# Patient Record
Sex: Male | Born: 1950 | Race: White | Hispanic: No | Smoking: Former smoker
Health system: Southern US, Community
[De-identification: ages and names within clinical notes are randomized; demographics above are authoritative.]

## PROBLEM LIST (undated history)

## (undated) DIAGNOSIS — I10 Essential (primary) hypertension: Secondary | ICD-10-CM

---

## 2011-06-24 ENCOUNTER — Emergency Department (HOSPITAL_COMMUNITY)
Admission: EM | Admit: 2011-06-24 | Discharge: 2011-06-24 | Disposition: A | Discharge status: Home / Self Care | Payer: 59 | Attending: Emergency Medicine | Admitting: Emergency Medicine

## 2011-06-24 ENCOUNTER — Emergency Department (HOSPITAL_COMMUNITY): Payer: 59

## 2011-06-24 DIAGNOSIS — R232 Flushing: Secondary | ICD-10-CM | POA: Insufficient documentation

## 2011-06-24 DIAGNOSIS — R61 Generalized hyperhidrosis: Secondary | ICD-10-CM | POA: Insufficient documentation

## 2011-06-24 DIAGNOSIS — R55 Syncope and collapse: Secondary | ICD-10-CM | POA: Insufficient documentation

## 2011-06-24 DIAGNOSIS — R42 Dizziness and giddiness: Secondary | ICD-10-CM | POA: Insufficient documentation

## 2011-06-24 LAB — URINALYSIS, ROUTINE W REFLEX MICROSCOPIC
Bilirubin Urine: NEGATIVE
Glucose, UA: 100 mg/dL — AB
Hgb urine dipstick: NEGATIVE
Ketones, ur: NEGATIVE mg/dL
pH: 6 (ref 5.0–8.0)

## 2011-06-24 LAB — COMPREHENSIVE METABOLIC PANEL
ALT: 13 U/L (ref 0–53)
AST: 17 U/L (ref 0–37)
Albumin: 3 g/dL — ABNORMAL LOW (ref 3.5–5.2)
Alkaline Phosphatase: 41 U/L (ref 39–117)
CO2: 23 mEq/L (ref 19–32)
Chloride: 108 mEq/L (ref 96–112)
GFR calc non Af Amer: >60 mL/min (ref >60)
Potassium: 3.8 mEq/L (ref 3.5–5.1)
Sodium: 141 mEq/L (ref 135–145)
Total Bilirubin: 0.2 mg/dL — ABNORMAL LOW (ref 0.3–1.2)

## 2011-06-24 LAB — CBC
HCT: 37.4 % — ABNORMAL LOW (ref 39.0–52.0)
MCH: 31.3 pg (ref 26.0–34.0)
MCV: 90.1 fL (ref 78.0–100.0)
RBC: 4.15 MIL/uL — ABNORMAL LOW (ref 4.22–5.81)
WBC: 4.6 10*3/uL (ref 4.0–10.5)

## 2011-06-24 LAB — DIFFERENTIAL
Eosinophils Absolute: 0.3 10*3/uL (ref 0.0–0.7)
Eosinophils Relative: 7 % — ABNORMAL HIGH (ref 0–5)
Lymphocytes Relative: 35 % (ref 12–46)
Lymphs Abs: 1.6 10*3/uL (ref 0.7–4.0)
Monocytes Relative: 7 % (ref 3–12)
Neutrophils Relative %: 51 % (ref 43–77)

## 2011-06-24 LAB — TROPONIN I: Troponin I: <0.3 ng/mL (ref <0.30)

## 2011-06-24 LAB — CK TOTAL AND CKMB (NOT AT ARMC): Relative Index: 0.9 (ref 0.0–2.5)

## 2011-06-24 LAB — URINE MICROSCOPIC-ADD ON

## 2011-06-25 LAB — URINE CULTURE: Culture  Setup Time: 201207222124

## 2014-04-29 ENCOUNTER — Encounter (HOSPITAL_COMMUNITY): Payer: Self-pay | Admitting: Emergency Medicine

## 2014-04-29 ENCOUNTER — Emergency Department (HOSPITAL_COMMUNITY)
Admission: EM | Admit: 2014-04-29 | Discharge: 2014-04-29 | Discharge status: Against Medical Advice | Payer: 59 | Attending: Emergency Medicine | Admitting: Emergency Medicine

## 2014-04-29 DIAGNOSIS — R42 Dizziness and giddiness: Secondary | ICD-10-CM | POA: Insufficient documentation

## 2014-04-29 HISTORY — DX: Essential (primary) hypertension: I10

## 2014-04-29 LAB — CBC
HCT: 41.5 % (ref 39.0–52.0)
Hemoglobin: 14.7 g/dL (ref 13.0–17.0)
MCH: 32.5 pg (ref 26.0–34.0)
MCHC: 35.4 g/dL (ref 30.0–36.0)
MCV: 91.8 fL (ref 78.0–100.0)
Platelets: 247 10*3/uL (ref 150–400)
RBC: 4.52 MIL/uL (ref 4.22–5.81)
RDW: 12.3 % (ref 11.5–15.5)
WBC: 8.4 10*3/uL (ref 4.0–10.5)

## 2014-04-29 LAB — COMPREHENSIVE METABOLIC PANEL
ALK PHOS: 48 U/L (ref 39–117)
ALT: 23 U/L (ref 0–53)
AST: 23 U/L (ref 0–37)
Albumin: 3.9 g/dL (ref 3.5–5.2)
BILIRUBIN TOTAL: 0.4 mg/dL (ref 0.3–1.2)
BUN: 18 mg/dL (ref 6–23)
CHLORIDE: 102 meq/L (ref 96–112)
CO2: 23 mEq/L (ref 19–32)
Calcium: 9.7 mg/dL (ref 8.4–10.5)
Creatinine, Ser: 1.03 mg/dL (ref 0.50–1.35)
GFR calc Af Amer: 87 mL/min — ABNORMAL LOW (ref >90)
GFR, EST NON AFRICAN AMERICAN: 75 mL/min — AB (ref >90)
GLUCOSE: 78 mg/dL (ref 70–99)
POTASSIUM: 4.4 meq/L (ref 3.7–5.3)
SODIUM: 138 meq/L (ref 137–147)
Total Protein: 7 g/dL (ref 6.0–8.3)

## 2014-04-29 LAB — DIFFERENTIAL
Basophils Absolute: 0 10*3/uL (ref 0.0–0.1)
Basophils Relative: 0 % (ref 0–1)
EOS PCT: 7 % — AB (ref 0–5)
Eosinophils Absolute: 0.6 10*3/uL (ref 0.0–0.7)
LYMPHS ABS: 2.1 10*3/uL (ref 0.7–4.0)
Lymphocytes Relative: 25 % (ref 12–46)
Monocytes Absolute: 0.8 10*3/uL (ref 0.1–1.0)
Monocytes Relative: 10 % (ref 3–12)
NEUTROS PCT: 58 % (ref 43–77)
Neutro Abs: 4.8 10*3/uL (ref 1.7–7.7)

## 2014-04-29 LAB — I-STAT TROPONIN, ED: Troponin i, poc: 0 ng/mL (ref 0.00–0.08)

## 2014-04-29 NOTE — ED Notes (Signed)
Pt stated that he was feeling better and was going to go home

## 2014-04-29 NOTE — ED Notes (Signed)
Pt reports waking up this am with numbness to left arm, dizziness and "a salty taste in his mouth." states that symptoms have resolved pta, grips are equal, no arm drift or facial droop noted at triage.

## 2014-04-30 ENCOUNTER — Encounter (HOSPITAL_COMMUNITY): Payer: Self-pay | Admitting: Emergency Medicine

## 2014-04-30 ENCOUNTER — Emergency Department (HOSPITAL_COMMUNITY)
Admission: EM | Admit: 2014-04-30 | Discharge: 2014-04-30 | Disposition: A | Discharge status: Home / Self Care | Payer: 59 | Attending: Emergency Medicine | Admitting: Emergency Medicine

## 2014-04-30 ENCOUNTER — Emergency Department (HOSPITAL_COMMUNITY): Payer: 59

## 2014-04-30 DIAGNOSIS — Z7982 Long term (current) use of aspirin: Secondary | ICD-10-CM | POA: Insufficient documentation

## 2014-04-30 DIAGNOSIS — I10 Essential (primary) hypertension: Secondary | ICD-10-CM | POA: Insufficient documentation

## 2014-04-30 DIAGNOSIS — G459 Transient cerebral ischemic attack, unspecified: Secondary | ICD-10-CM | POA: Insufficient documentation

## 2014-04-30 DIAGNOSIS — Z79899 Other long term (current) drug therapy: Secondary | ICD-10-CM | POA: Insufficient documentation

## 2014-04-30 MED ORDER — ASPIRIN EC 325 MG PO TBEC: 325.0000 mg | DELAYED_RELEASE_TABLET | Freq: Every day | ORAL | Status: AC

## 2014-04-30 NOTE — ED Provider Notes (Signed)
CSN: [id number]     Arrival date & time 04/30/14  1497 History   First MD Initiated Contact with Patient 04/30/14 [phone]     Chief Complaint  Patient presents with  . Dizziness     (Consider location/radiation/quality/duration/timing/severity/associated sxs/prior Treatment) HPI 63 year old male with history of hypertension no history of stroke no history of coronary artery disease nonsmoker last known well Wednesday evening at about 10:30 PM yesterday morning Thursday woke up and had several hours between 4-6 hours of left arm slight numbness and tingling with abnormal sensation to his tongue as well as slight difficulty walking with a slightly abnormal possibly ataxic gait without lightheadedness or sensation of vertigo, he had no headache, he had no change in speech vision swallowing or understanding and did not feel any weakness; his symptoms completely resolved at about noon yesterday and has been asymptomatic since that time, he came to the emergency department yesterday evening and had blood drawn but the ED was busy and he left without being seen by provider, he woke up this morning feeling normal but decided he should come back to the ED to complete his evaluation. Past Medical History  Diagnosis Date  . Hypertension    History reviewed. No pertinent past surgical history. No family history on file. History  Substance Use Topics  . Smoking status: Never Smoker   . Smokeless tobacco: Not on file  . Alcohol Use: Yes     Comment: occ    Review of Systems 10 Systems reviewed and are negative for acute change except as noted in the HPI.   Allergies  Review of patient's allergies indicates no known allergies.  Home Medications   Prior to Admission medications   Medication Sig Start Date End Date Taking? Authorizing Provider  losartan (COZAAR) 50 MG tablet Take 50 mg by mouth daily.   Yes Historical Provider, MD  aspirin EC 325 MG tablet Take 1 tablet (325 mg total) by mouth  daily. 04/30/14   Babette Relic, MD   BP 128/80  Pulse 61  Temp(Src) 98.2 F (36.8 C)  Resp 19  SpO2 99% Physical Exam  Nursing note and vitals reviewed. Constitutional:  Awake, alert, nontoxic appearance with baseline speech for patient.  HENT:  Head: Atraumatic.  Mouth/Throat: No oropharyngeal exudate.  Eyes: EOM are normal. Pupils are equal, round, and reactive to light. Right eye exhibits no discharge. Left eye exhibits no discharge.  No nystagmus; negative test of skew  Neck: Neck supple.  Cardiovascular: Normal rate and regular rhythm.   No murmur heard. Pulmonary/Chest: Effort normal and breath sounds normal. No stridor. No respiratory distress. He has no wheezes. He has no rales. He exhibits no tenderness.  Abdominal: Soft. Bowel sounds are normal. He exhibits no mass. There is no tenderness. There is no rebound.  Musculoskeletal: He exhibits no tenderness.  Baseline ROM, moves extremities with no obvious new focal weakness.  Lymphadenopathy:    He has no cervical adenopathy.  Neurological: He is alert.  Awake, alert, cooperative and aware of situation; motor strength bilaterally; sensation normal to light touch bilaterally; peripheral visual fields full to confrontation; no facial asymmetry; tongue midline; major cranial nerves appear intact; no pronator drift, normal finger to nose bilaterally, baseline gait without new ataxia.  Skin: No rash noted.  Psychiatric: He has a normal mood and affect.    ED Course  Procedures (including critical care time) D/w Neuro recs discharge OutPt f/u.Patient informed of clinical course, understand medical decision-making process, and agree  with plan.  Labs Review Labs Reviewed - No data to display  Imaging Review Mr Brain Wo Contrast  04/30/2014   CLINICAL DATA:  Left arm numbness and gait ataxia yesterday. Dizziness.  EXAM: MRI HEAD WITHOUT CONTRAST  TECHNIQUE: Multiplanar, multiecho pulse sequences of the brain and surrounding  structures were obtained without intravenous contrast.  COMPARISON:  None.  FINDINGS: There is no evidence of acute infarct or intracranial hemorrhage. No significant white matter disease is seen. There is no evidence of mass, midline shift, or extra-axial fluid collection. There is mild cerebral atrophy.  Orbits are unremarkable. Minimal maxillary sinus mucosal thickening is noted bilaterally. There is mild bilateral ethmoid air cell mucosal thickening. Mastoid air cells are clear. Major intracranial vascular flow voids are preserved.  IMPRESSION: Mild cerebral atrophy, otherwise unremarkable appearance of the brain.   Electronically Signed   By: Logan Bores   On: 04/30/2014 12:08     EKG Interpretation   Date/Time:  Friday Apr 30 2014 08:28:35 EDT Ventricular Rate:  64 PR Interval:  144 QRS Duration: 90 QT Interval:  400 QTC Calculation: 412 R Axis:   84 Text Interpretation:  Normal sinus rhythm Normal ECG No significant change  since last tracing Confirmed by [facility] [facility]  MD, Jenny Reichmann ([id number]) on 04/30/2014  10:15:04 AM      MDM   Final diagnoses:  TIA (transient ischemic attack)    I doubt any other EMC precluding discharge at this time including, but not necessarily limited to the following:CVA.    Babette Relic, MD 05/02/14 [phone]

## 2014-04-30 NOTE — ED Notes (Signed)
Returned from MRI 

## 2014-04-30 NOTE — ED Notes (Signed)
Patient transported to MRI 

## 2014-04-30 NOTE — ED Notes (Addendum)
Pt reports onset of dizziness since yesterday. Numbness in left arm. No arm drift, grips equal, speech clear, no facial droop. Came here yesterday, waited until 2am, was not seen and decided to go home. Blood work and EKG done yesterday. Does not want to have blood work repeated at this time. Repeated EKG now. Denies pain.

## 2014-04-30 NOTE — Discharge Instructions (Signed)
You may have had a transient ischemic attack (TIA). This means that the nervous system did not work properly for a short time. It is caused by a low oxygen supply to an area of your brain. It may be caused by a small blood clot or hardening of the arteries. This is a temporary neurologic condition. It usually gets better within thirty minutes, but always within twenty-four hours. If this does not resolve within that time period, it is defined as a stroke. TIA's are warning signs that you are at risk for having a stroke. A small percentage of patients who have had a TIA will have a stroke within a couple days, and up to 20% will have a stroke within 3 months. °SEEK IMMEDIATE MEDICAL ATTENTION (Call 911) IF: °The original symptoms that brought you in are getting worse, or if you develop any new change in speech, vision, swallowing, or understanding, incoordination, weakness, numbness, tingling, dizziness, fainting, severe headache, chest pain, or other concerns. Some patients who are having a stroke are eligible to receive a medication which may improve their outcome, but the drug usually must be given within three hours from when symptoms first occur. So if you think you might be having stroke symptoms, don't wait, call 911. °

## 2014-04-30 NOTE — ED Notes (Addendum)
Patient returned from MRI, monitor reapplied

## 2014-05-28 ENCOUNTER — Telehealth: Payer: Self-pay | Admitting: Neurology

## 2014-05-28 ENCOUNTER — Ambulatory Visit (INDEPENDENT_AMBULATORY_CARE_PROVIDER_SITE_OTHER): Payer: 59 | Admitting: Neurology

## 2014-05-28 ENCOUNTER — Encounter: Payer: Self-pay | Admitting: Neurology

## 2014-05-28 VITALS — BP 122/78 | HR 73 | Temp 98.1°F | Ht 70.5 in | Wt 177.7 lb

## 2014-05-28 DIAGNOSIS — G459 Transient cerebral ischemic attack, unspecified: Secondary | ICD-10-CM

## 2014-05-28 NOTE — Patient Instructions (Addendum)
You probably had a transient ischemic attack (TIA), which is a "mini-stroke" 1.  Start taking aspirin 81mg  daily 2.  We will check cholesterol (fasting lipid panel) and check for diabetes (hemoglobin A1c) 3.  We will check CTA of the head and neck [place] 05/31/14 8:15 am  Nothing to eat or drink after Midnight  4.  We will check 2d echocardiogram July 3 at 9:45am  [place]  5.  Blood pressure control is important 6.  Mediterranean diet is recommended 7.  Follow up in 3 months.

## 2014-05-28 NOTE — Progress Notes (Signed)
NEUROLOGY CONSULTATION NOTE  Joseph Curry MRN: [id number] DOB: 02-17-1951  Referring provider: Riki Altes, MD  Primary care provider: Matthias Hughs, MD  Reason for consult:  TIA  HISTORY OF PRESENT ILLNESS: Joseph Curry is a 63 year old right-handed man with history of hypertension who presents for evaluation of TIA.  Records and images personally reviewed.  On the morning of 04/29/14, he woke up, feeling fine.  While in the shower, he noted left arm numbness and tingling, lightheadedness, and salty taste in his mouth.  He also felt a little unbalanced but no spinning sensation.  Symptoms lasted about 4-6 hours.  There was no associated headache, vision disturbance or focal weakness.  He did go to the ED, as symptoms resolved, he left the ED but returned the next day to be checked out.  MRI BRAIN WO CONTRAST was performed, which showed mild atrophy.  No acute abnormalities.  EKG showed normal sinus rhythm of 64 bpm.  He felt a little unsteady for a few more days but then this resolved.  The only medication he takes is losartan.  PAST MEDICAL HISTORY: Past Medical History  Diagnosis Date  . Hypertension     PAST SURGICAL HISTORY: No past surgical history on file.  MEDICATIONS: Current Outpatient Prescriptions on File Prior to Visit  Medication Sig Dispense Refill  . losartan (COZAAR) 50 MG tablet Take 50 mg by mouth daily.      Marland Kitchen aspirin EC 325 MG tablet Take 1 tablet (325 mg total) by mouth daily.  30 tablet  0   No current facility-administered medications on file prior to visit.    ALLERGIES: No Known Allergies  FAMILY HISTORY: No family history on file.  SOCIAL HISTORY: History   Social History  . Marital Status: Divorced    Spouse Name: N/A    Number of Children: N/A  . Years of Education: N/A   Occupational History  . Not on file.   Social History Main Topics  . Smoking status: Former Research scientist (life sciences)  . Smokeless tobacco: Not on file  . Alcohol Use: Yes   Comment: occ  . Drug Use: No  . Sexual Activity: Not on file   Other Topics Concern  . Not on file   Social History Narrative  . No narrative on file    REVIEW OF SYSTEMS: Constitutional: No fevers, chills, or sweats, no generalized fatigue, change in appetite Eyes: No visual changes, double vision, eye pain Ear, nose and throat: No hearing loss, ear pain, nasal congestion, sore throat Cardiovascular: No chest pain, palpitations Respiratory:  No shortness of breath at rest or with exertion, wheezes GastrointestinaI: No nausea, vomiting, diarrhea, abdominal pain, fecal incontinence Genitourinary:  No dysuria, urinary retention or frequency Musculoskeletal:  No neck pain, back pain Integumentary: No rash, pruritus, skin lesions Neurological: as above Psychiatric: No depression, insomnia, anxiety Endocrine: No palpitations, fatigue, diaphoresis, mood swings, change in appetite, change in weight, increased thirst Hematologic/Lymphatic:  No anemia, purpura, petechiae. Allergic/Immunologic: no itchy/runny eyes, nasal congestion, recent allergic reactions, rashes  PHYSICAL EXAM: Filed Vitals:   05/28/14 0836  BP: 122/78  Pulse: 73  Temp: 98.1 F (36.7 C)   General: No acute distress Head:  Normocephalic/atraumatic Neck: supple, no paraspinal tenderness, full range of motion Back: No paraspinal tenderness Heart: regular rate and rhythm Lungs: Clear to auscultation bilaterally. Vascular: No carotid bruits. Neurological Exam: Mental status: alert and oriented to person, place, and time, recent and remote memory intact, fund of knowledge intact, attention and  concentration intact, speech fluent and not dysarthric, language intact. Cranial nerves: CN I: not tested CN II: pupils equal, round and reactive to light, visual fields intact, fundi unremarkable, without vessel changes, exudates, hemorrhages or papilledema. CN III, IV, VI:  full range of motion, no nystagmus, no ptosis CN  V: facial sensation intact CN VII: upper and lower face symmetric CN VIII: hearing intact CN IX, X: gag intact, uvula midline CN XI: sternocleidomastoid and trapezius muscles intact CN XII: tongue midline Bulk & Tone: normal, no fasciculations. Motor: 5 out of 5 throughout Sensation: Temperature and vibration intact Deep Tendon Reflexes: 2+ throughout, toes downgoing Finger to nose testing: No dysmetria Heel to shin: No dysmetria Gait: Normal station and stride. Able to turn and walk in tandem. Romberg negative.  IMPRESSION: Transient ischemic attack. Currently symptom free  PLAN: 1. Start aspirin 81 mg daily for secondary stroke prevention. 2. Check fasting lipid panel (LDL goal should be less than 100) and hemoglobin A1c 3. we will check a CTA of the head and neck. 4. We will check a 2-D echo. 5. Recommended Mediterranean diet. 6. Followup in 3 months.  Thank you for allowing me to take part in the care of this patient.  Metta Clines, DO  CC:  Matthias Hughs, MD

## 2014-05-28 NOTE — Telephone Encounter (Signed)
Pt is returning your call please call °

## 2014-05-31 ENCOUNTER — Ambulatory Visit (HOSPITAL_COMMUNITY): Payer: 59

## 2014-06-04 ENCOUNTER — Other Ambulatory Visit (HOSPITAL_COMMUNITY): Payer: 59

## 2014-06-09 ENCOUNTER — Ambulatory Visit (HOSPITAL_COMMUNITY): Payer: 59

## 2014-06-15 LAB — HEMOGLOBIN A1C
Hgb A1c MFr Bld: 5.9 % — ABNORMAL HIGH (ref <5.7)
MEAN PLASMA GLUCOSE: 123 mg/dL — AB (ref <117)

## 2014-06-15 LAB — LIPID PANEL
CHOL/HDL RATIO: 3 ratio
Cholesterol: 217 mg/dL — ABNORMAL HIGH (ref 0–200)
HDL: 73 mg/dL (ref >39)
LDL CALC: 118 mg/dL — AB (ref 0–99)
Triglycerides: 132 mg/dL (ref <150)
VLDL: 26 mg/dL (ref 0–40)

## 2014-06-16 ENCOUNTER — Ambulatory Visit (HOSPITAL_COMMUNITY)
Admission: RE | Admit: 2014-06-16 | Discharge: 2014-06-16 | Disposition: A | Discharge status: Home / Self Care | Payer: 59 | Source: Ambulatory Visit | Attending: Neurology | Admitting: Neurology

## 2014-06-16 ENCOUNTER — Telehealth: Payer: Self-pay | Admitting: Neurology

## 2014-06-16 DIAGNOSIS — I7090 Unspecified atherosclerosis: Secondary | ICD-10-CM | POA: Insufficient documentation

## 2014-06-16 DIAGNOSIS — G459 Transient cerebral ischemic attack, unspecified: Secondary | ICD-10-CM

## 2014-06-16 MED ORDER — IOHEXOL 350 MG/ML SOLN
80.0000 mL | Freq: Once | INTRAVENOUS | Status: AC | PRN
  Administered 2014-06-16: 80 mL via INTRAVENOUS

## 2014-06-16 NOTE — Telephone Encounter (Signed)
Discussed results of CTA with Joseph Curry.  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  Based on the symptoms of his TIA, I can't say if this focal stenosis is the main cause.  However, he is at greater risk for a posterior circulation stroke.  Typically, there is no surgical intervention for the vertebral arteries and would just continue antiplatelet therapy.  I offered to find out if there are any local vascular interventional radiologists that may want to take a look at it, but he would just like to continue the antiplatelet therapy for now.  We will discuss further at next visit.

## 2014-08-02 ENCOUNTER — Telehealth: Payer: Self-pay | Admitting: Neurology

## 2014-08-02 NOTE — Telephone Encounter (Signed)
Pt cancelled his appt on 08-31-14 to see you he did not resch

## 2014-08-11 ENCOUNTER — Ambulatory Visit: Payer: 59 | Admitting: Neurology

## 2014-08-31 ENCOUNTER — Ambulatory Visit: Payer: 59 | Admitting: Neurology

## 2016-03-28 DIAGNOSIS — I10 Essential (primary) hypertension: Secondary | ICD-10-CM | POA: Diagnosis not present

## 2016-08-29 DIAGNOSIS — Z85828 Personal history of other malignant neoplasm of skin: Secondary | ICD-10-CM | POA: Diagnosis not present

## 2016-08-29 DIAGNOSIS — L570 Actinic keratosis: Secondary | ICD-10-CM | POA: Diagnosis not present

## 2016-10-01 DIAGNOSIS — E7800 Pure hypercholesterolemia, unspecified: Secondary | ICD-10-CM | POA: Diagnosis not present

## 2016-10-01 DIAGNOSIS — Z0000 Encounter for general adult medical examination without abnormal findings: Secondary | ICD-10-CM | POA: Diagnosis not present

## 2016-10-01 DIAGNOSIS — R7301 Impaired fasting glucose: Secondary | ICD-10-CM | POA: Diagnosis not present

## 2016-10-01 DIAGNOSIS — G459 Transient cerebral ischemic attack, unspecified: Secondary | ICD-10-CM | POA: Diagnosis not present

## 2016-10-01 DIAGNOSIS — I10 Essential (primary) hypertension: Secondary | ICD-10-CM | POA: Diagnosis not present

## 2016-10-01 DIAGNOSIS — Z6826 Body mass index (BMI) 26.0-26.9, adult: Secondary | ICD-10-CM | POA: Diagnosis not present

## 2016-11-28 DIAGNOSIS — E7800 Pure hypercholesterolemia, unspecified: Secondary | ICD-10-CM | POA: Diagnosis not present

## 2016-11-28 DIAGNOSIS — I10 Essential (primary) hypertension: Secondary | ICD-10-CM | POA: Diagnosis not present

## 2016-12-26 DIAGNOSIS — I10 Essential (primary) hypertension: Secondary | ICD-10-CM | POA: Diagnosis not present

## 2016-12-26 DIAGNOSIS — E7800 Pure hypercholesterolemia, unspecified: Secondary | ICD-10-CM | POA: Diagnosis not present

## 2017-01-10 DIAGNOSIS — H2513 Age-related nuclear cataract, bilateral: Secondary | ICD-10-CM | POA: Diagnosis not present

## 2017-06-19 ENCOUNTER — Encounter (HOSPITAL_COMMUNITY): Payer: Self-pay | Admitting: Emergency Medicine

## 2017-06-19 ENCOUNTER — Emergency Department (HOSPITAL_COMMUNITY)
Admission: EM | Admit: 2017-06-19 | Discharge: 2017-06-19 | Disposition: A | Discharge status: Home / Self Care | Payer: PPO | Attending: Physician Assistant | Admitting: Physician Assistant

## 2017-06-19 ENCOUNTER — Emergency Department (HOSPITAL_COMMUNITY): Payer: PPO

## 2017-06-19 DIAGNOSIS — Z79899 Other long term (current) drug therapy: Secondary | ICD-10-CM | POA: Insufficient documentation

## 2017-06-19 DIAGNOSIS — R112 Nausea with vomiting, unspecified: Secondary | ICD-10-CM | POA: Diagnosis not present

## 2017-06-19 DIAGNOSIS — Z7982 Long term (current) use of aspirin: Secondary | ICD-10-CM | POA: Diagnosis not present

## 2017-06-19 DIAGNOSIS — R55 Syncope and collapse: Secondary | ICD-10-CM | POA: Diagnosis not present

## 2017-06-19 DIAGNOSIS — R404 Transient alteration of awareness: Secondary | ICD-10-CM | POA: Diagnosis not present

## 2017-06-19 DIAGNOSIS — Z87891 Personal history of nicotine dependence: Secondary | ICD-10-CM | POA: Insufficient documentation

## 2017-06-19 DIAGNOSIS — R001 Bradycardia, unspecified: Secondary | ICD-10-CM | POA: Diagnosis not present

## 2017-06-19 DIAGNOSIS — I10 Essential (primary) hypertension: Secondary | ICD-10-CM | POA: Diagnosis not present

## 2017-06-19 LAB — CBC
HCT: 39.8 % (ref 39.0–52.0)
Hemoglobin: 13.8 g/dL (ref 13.0–17.0)
MCH: 30.9 pg (ref 26.0–34.0)
MCHC: 34.7 g/dL (ref 30.0–36.0)
MCV: 89.2 fL (ref 78.0–100.0)
PLATELETS: 261 10*3/uL (ref 150–400)
RBC: 4.46 MIL/uL (ref 4.22–5.81)
RDW: 12.3 % (ref 11.5–15.5)
WBC: 9.6 10*3/uL (ref 4.0–10.5)

## 2017-06-19 LAB — COMPREHENSIVE METABOLIC PANEL
ALBUMIN: 3.8 g/dL (ref 3.5–5.0)
ALT: 27 U/L (ref 17–63)
AST: 23 U/L (ref 15–41)
Alkaline Phosphatase: 49 U/L (ref 38–126)
Anion gap: 9 (ref 5–15)
BILIRUBIN TOTAL: 0.8 mg/dL (ref 0.3–1.2)
BUN: 15 mg/dL (ref 6–20)
CHLORIDE: 102 mmol/L (ref 101–111)
CO2: 25 mmol/L (ref 22–32)
Calcium: 9.6 mg/dL (ref 8.9–10.3)
Creatinine, Ser: 1.04 mg/dL (ref 0.61–1.24)
GFR calc Af Amer: >60 mL/min (ref >60)
GFR calc non Af Amer: >60 mL/min (ref >60)
GLUCOSE: 149 mg/dL — AB (ref 65–99)
POTASSIUM: 3.5 mmol/L (ref 3.5–5.1)
SODIUM: 136 mmol/L (ref 135–145)
Total Protein: 6.3 g/dL — ABNORMAL LOW (ref 6.5–8.1)

## 2017-06-19 LAB — I-STAT TROPONIN, ED: Troponin i, poc: 0 ng/mL (ref 0.00–0.08)

## 2017-06-19 LAB — CBG MONITORING, ED: GLUCOSE-CAPILLARY: 131 mg/dL — AB (ref 65–99)

## 2017-06-19 MED ORDER — ONDANSETRON HCL 4 MG PO TABS: 4.0000 mg | ORAL_TABLET | Freq: Every day | ORAL | 0 refills | Status: AC | PRN

## 2017-06-19 MED ORDER — ONDANSETRON HCL 4 MG/2ML IJ SOLN
4.0000 mg | Freq: Once | INTRAMUSCULAR | Status: AC
  Administered 2017-06-19: 4 mg via INTRAVENOUS
  Filled 2017-06-19: qty 2

## 2017-06-19 MED ORDER — SODIUM CHLORIDE 0.9 % IV BOLUS (SEPSIS)
1000.0000 mL | Freq: Once | INTRAVENOUS | Status: AC
  Administered 2017-06-19: 1000 mL via INTRAVENOUS

## 2017-06-19 NOTE — ED Triage Notes (Signed)
Pt to ED via [facility] medic [facility] from barber shop with witnessed syncopal episode as complaint. On arrival to ED- pt is alert/oriented x 4, extremely diaphoretic, speech clear. Denies any chest pain, denies headache, denies dizziness-- c/o nausea. Vomited prior to arrival.

## 2017-06-19 NOTE — ED Notes (Signed)
Pt returned from xray-- vomited while in xray-- ate frozen fruit mixed with yogurt this am, vomitus had strawberries present

## 2017-06-19 NOTE — ED Notes (Signed)
Pt given cup of wate per MD.

## 2017-06-19 NOTE — ED Provider Notes (Signed)
[facility] DEPT Provider Note   CSN: [id number] Arrival date & time: 06/19/17  5176     History   Chief Complaint Chief Complaint  Patient presents with  . Loss of Consciousness    HPI Joseph Curry is a 66 y.o. male.  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  Pt began to feel dizzy, turned pale, and began to vomit as he was passing out.  He was unconscious for less than 30 seconds according to bystanders.  Pt reports feeling nauseous also during the event but was nauseous earlier after eating some frozen fruit he heated up in the microwave and yogurt.  Pt did not lose bowel or bladder continence.  He denies CP, SOB, fatigue, fever, or recent illness or travel.  Pt denies a family history of heart disease or syncopal events.  Pt drinks alcohol most nights and said he had 2-3 vodka drinks last night.  He is a never smoker, and has no history of cardiac disease.  He does mention he had one prior syncopal episode 6 years ago while seated at church that was very similar and was attributed to dehydration due to having too much wine the night prior and not drinking enough water.        Past Medical History:  Diagnosis Date  . Hypertension     There are no active problems to display for this patient.   History reviewed. No pertinent surgical history.     Home Medications    Prior to Admission medications   Medication Sig Start Date End Date Taking? Authorizing Provider  aspirin EC 325 MG tablet Take 1 tablet (325 mg total) by mouth daily. 04/30/14   Riki Altes, MD  losartan (COZAAR) 50 MG tablet Take 50 mg by mouth daily.    [provider]    Family History No family history on file.  Social History Social History  Substance Use Topics  . Smoking status: Former Research scientist (life sciences)  . Smokeless tobacco: Never Used  . Alcohol use Yes     Comment: occ     Allergies   Patient has no known  allergies.   Review of Systems Review of Systems  Constitutional: Negative for chills and fever.  HENT: Negative for ear pain and sore throat.   Eyes: Negative for pain and visual disturbance.  Respiratory: Negative for cough and shortness of breath.   Cardiovascular: Negative for chest pain and palpitations.  Gastrointestinal: Positive for nausea and vomiting. Negative for abdominal pain.  Genitourinary: Negative for dysuria and hematuria.  Musculoskeletal: Negative for arthralgias and back pain.  Skin: Negative for color change and rash.  Neurological: Positive for dizziness and syncope. Negative for seizures, weakness and headaches.  Psychiatric/Behavioral: Negative for agitation and behavioral problems.  All other systems reviewed and are negative.    Physical Exam Updated Vital Signs BP 112/77 (BP Location: Right Arm)   Pulse 62   Temp (!) 97.5 F (36.4 C) (Oral)   Resp 13   Ht 5\' 10"  (1.778 m)   Wt 79.4 kg (175 lb)   SpO2 97%   BMI 25.11 kg/m   Physical Exam  Constitutional: He is oriented to person, place, and time. He appears well-developed and well-nourished.  HENT:  Head: Normocephalic and atraumatic.  Eyes: Conjunctivae are normal.  Neck: Neck supple.  Cardiovascular: Regular rhythm and intact distal pulses.  Bradycardia present.  Exam reveals no gallop and no friction rub.  No murmur heard. Pulmonary/Chest: Effort normal and breath sounds normal. No respiratory distress.  Abdominal: Soft. There is no tenderness.  Musculoskeletal: He exhibits no edema or deformity.  Neurological: He is alert and oriented to person, place, and time. No cranial nerve deficit.  Skin: Skin is warm and dry.  Psychiatric: He has a normal mood and affect.  Nursing note and vitals reviewed.    ED Treatments / Results  Labs (all labs ordered are listed, but only abnormal results are displayed) Labs Reviewed  COMPREHENSIVE METABOLIC PANEL - Abnormal; Notable for the following:        Result Value   Glucose, Bld 149 (*)    Total Protein 6.3 (*)    All other components within normal limits  CBC  URINALYSIS, ROUTINE W REFLEX MICROSCOPIC  I-STAT TROPOININ, ED  CBG MONITORING, ED    EKG  EKG Interpretation  Date/Time:  Wednesday June 19 2017 09:29:23 EDT Ventricular Rate:  65 PR Interval:    QRS Duration: 102 QT Interval:  426 QTC Calculation: 443 R Axis:   100 Text Interpretation:  Sinus rhythm Right axis deviation Low voltage, precordial leads Normal sinus rhythm Confirmed by Zenovia Jarred [phone]) on 06/19/2017 10:00:27 AM       Radiology Dg Chest 2 View  Result Date: 06/19/2017 CLINICAL DATA:  66 year old male with syncope and nausea at 0830 hours today. EXAM: CHEST  2 VIEW COMPARISON:  CTA neck 06/16/2014 FINDINGS: Semi upright AP and lateral views of the chest. Mild cardiomegaly with left atrial enlargement suspected. Other mediastinal contours are within normal limits. The lungs are clear. No pneumothorax, pulmonary edema, or pleural effusion. Visualized tracheal air column is within normal limits. No acute osseous abnormality identified. Negative visible bowel gas pattern. IMPRESSION: 1.  No acute cardiopulmonary abnormality. 2. Probable left atrial enlargement. Electronically Signed   By: Genevie Ann M.D.   On: 06/19/2017 10:43    Procedures Procedures (including critical care time)  Medications Ordered in ED Medications  ondansetron (ZOFRAN) injection 4 mg (4 mg Intravenous Given 06/19/17 1046)  sodium chloride 0.9 % bolus 1,000 mL (1,000 mLs Intravenous New Bag/Given 06/19/17 1046)     Initial Impression / Assessment and Plan / ED Course  I have reviewed the triage vital signs and the nursing notes.  Pertinent labs & imaging results that were available during my care of the patient were reviewed by me and considered in my medical decision making (see chart for details).     Syncope: DDX includes ACS, arrythmia, vasovagal syncope, less  likely PE wells score 0 -ACS workup negative -Head CT negative -Zofran and NS improved pts symptoms dramatically -After discussing with the patient the risks of not staying to be monitored for possible arrhthymias for 24 hours the patient feels well enough to go home and understands the risks.   -Pt tolerating PO well and a nurse monitoring pt walking around hospital with no issues as documented in chart.       Final Clinical Impressions(s) / ED Diagnoses   Final diagnoses:  None    New Prescriptions New Prescriptions   No medications on file     Katherine Roan, MD 06/19/17 1642    Macarthur Critchley, MD 06/20/17 [phone]

## 2017-06-25 DIAGNOSIS — R55 Syncope and collapse: Secondary | ICD-10-CM | POA: Diagnosis not present

## 2017-09-23 DIAGNOSIS — L570 Actinic keratosis: Secondary | ICD-10-CM | POA: Diagnosis not present

## 2017-09-23 DIAGNOSIS — Z85828 Personal history of other malignant neoplasm of skin: Secondary | ICD-10-CM | POA: Diagnosis not present

## 2017-09-23 DIAGNOSIS — L821 Other seborrheic keratosis: Secondary | ICD-10-CM | POA: Diagnosis not present

## 2017-10-04 DIAGNOSIS — I10 Essential (primary) hypertension: Secondary | ICD-10-CM | POA: Diagnosis not present

## 2017-10-04 DIAGNOSIS — E7800 Pure hypercholesterolemia, unspecified: Secondary | ICD-10-CM | POA: Diagnosis not present

## 2017-10-04 DIAGNOSIS — Z1211 Encounter for screening for malignant neoplasm of colon: Secondary | ICD-10-CM | POA: Diagnosis not present

## 2017-10-04 DIAGNOSIS — R7301 Impaired fasting glucose: Secondary | ICD-10-CM | POA: Diagnosis not present

## 2017-12-12 DIAGNOSIS — Z1211 Encounter for screening for malignant neoplasm of colon: Secondary | ICD-10-CM | POA: Diagnosis not present

## 2017-12-12 DIAGNOSIS — Z8673 Personal history of transient ischemic attack (TIA), and cerebral infarction without residual deficits: Secondary | ICD-10-CM | POA: Diagnosis not present

## 2017-12-12 DIAGNOSIS — E119 Type 2 diabetes mellitus without complications: Secondary | ICD-10-CM | POA: Diagnosis not present

## 2017-12-12 DIAGNOSIS — I10 Essential (primary) hypertension: Secondary | ICD-10-CM | POA: Diagnosis not present

## 2018-07-07 DIAGNOSIS — I10 Essential (primary) hypertension: Secondary | ICD-10-CM | POA: Diagnosis not present

## 2018-07-07 DIAGNOSIS — Z23 Encounter for immunization: Secondary | ICD-10-CM | POA: Diagnosis not present

## 2018-07-07 DIAGNOSIS — E7800 Pure hypercholesterolemia, unspecified: Secondary | ICD-10-CM | POA: Diagnosis not present

## 2018-09-23 DIAGNOSIS — Z85828 Personal history of other malignant neoplasm of skin: Secondary | ICD-10-CM | POA: Diagnosis not present

## 2018-09-23 DIAGNOSIS — L821 Other seborrheic keratosis: Secondary | ICD-10-CM | POA: Diagnosis not present

## 2018-09-23 DIAGNOSIS — L570 Actinic keratosis: Secondary | ICD-10-CM | POA: Diagnosis not present

## 2018-10-07 DIAGNOSIS — Z0000 Encounter for general adult medical examination without abnormal findings: Secondary | ICD-10-CM | POA: Diagnosis not present

## 2018-10-07 DIAGNOSIS — I10 Essential (primary) hypertension: Secondary | ICD-10-CM | POA: Diagnosis not present

## 2018-10-07 DIAGNOSIS — R7301 Impaired fasting glucose: Secondary | ICD-10-CM | POA: Diagnosis not present

## 2018-10-07 DIAGNOSIS — E7800 Pure hypercholesterolemia, unspecified: Secondary | ICD-10-CM | POA: Diagnosis not present

## 2018-10-07 DIAGNOSIS — Z130 Encounter for screening for diseases of the blood and blood-forming organs and certain disorders involving the immune mechanism: Secondary | ICD-10-CM | POA: Diagnosis not present

## 2018-10-07 DIAGNOSIS — Z1159 Encounter for screening for other viral diseases: Secondary | ICD-10-CM | POA: Diagnosis not present

## 2018-12-08 DIAGNOSIS — H2513 Age-related nuclear cataract, bilateral: Secondary | ICD-10-CM | POA: Diagnosis not present

## 2019-03-13 IMAGING — CT CT HEAD W/O CM
3 series · 15 of 47 positions shown, 18 images · non-contrast
Comparison: 06/16/2014

CLINICAL DATA: Syncopal episode

EXAM:
CT HEAD WITHOUT CONTRAST
TECHNIQUE: Contiguous axial images were obtained from the base of the skull
through the vertex without intravenous contrast.

[Series 3: head 5.0 h30s · axial · 0.46mm/px · z∈[-99,+41]mm · 9 of 34 slices shown, 12 images]
[im 3/34  brain]
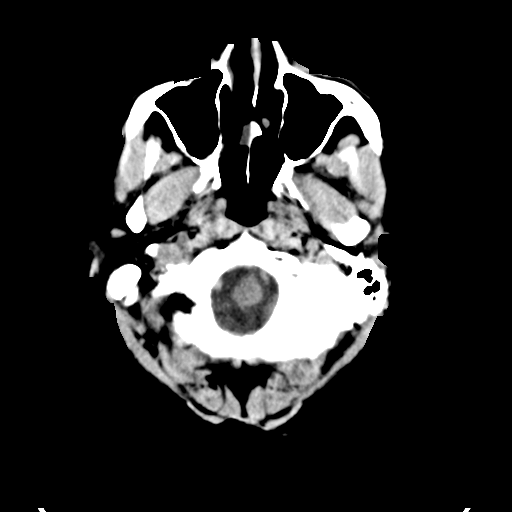
[im 3/34  bone]
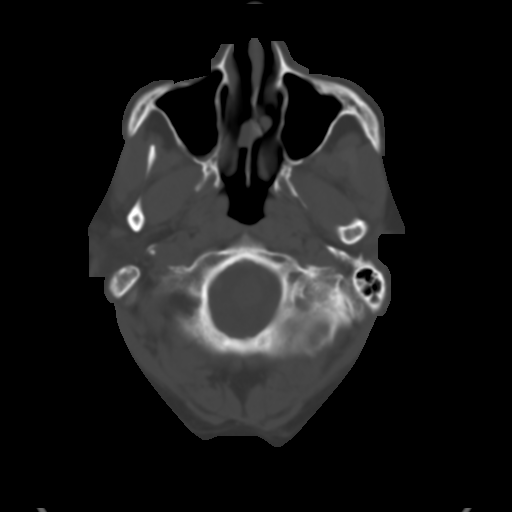
[im 6/34  brain]
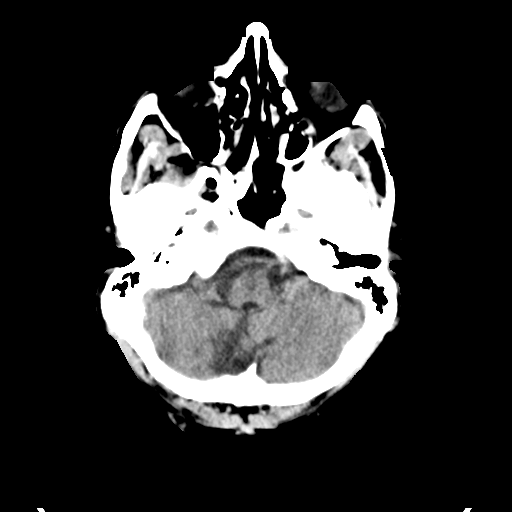
[im 10/34  brain]
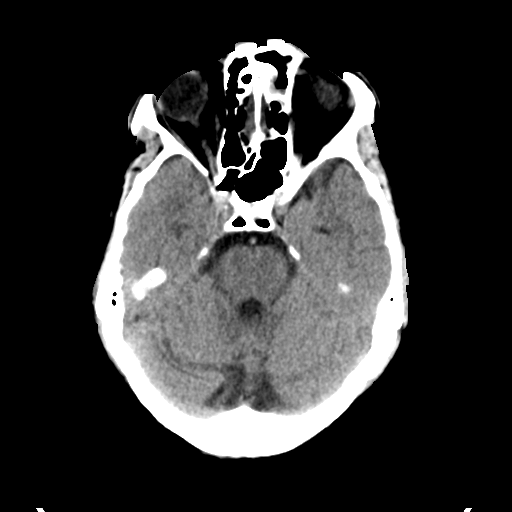
[im 13/34  brain]
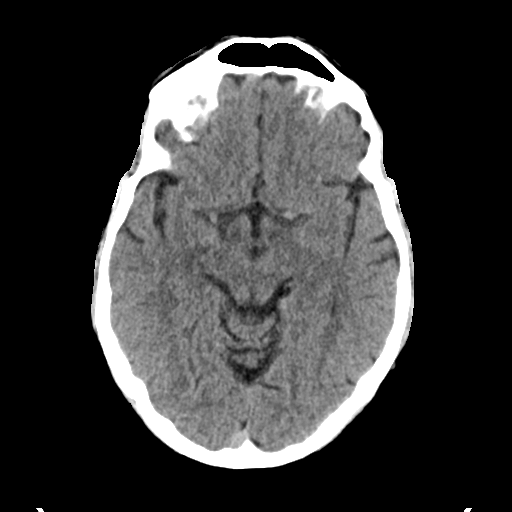
[im 18/34  brain]
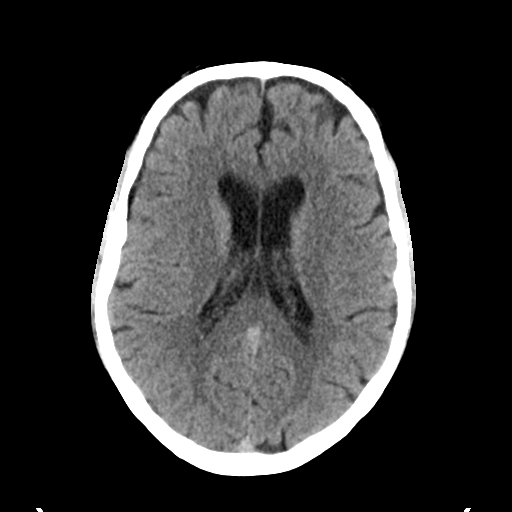
[im 18/34  bone]
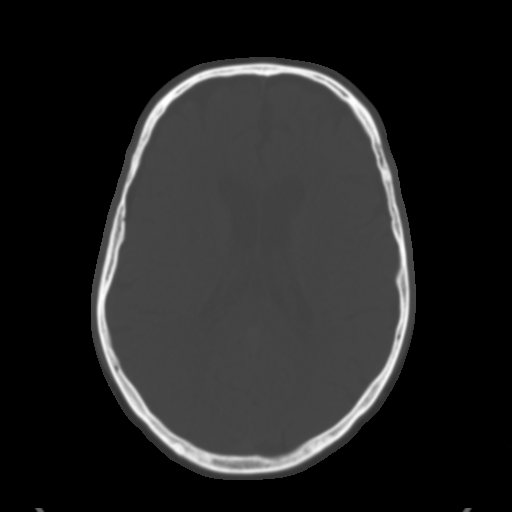
[im 21/34  brain]
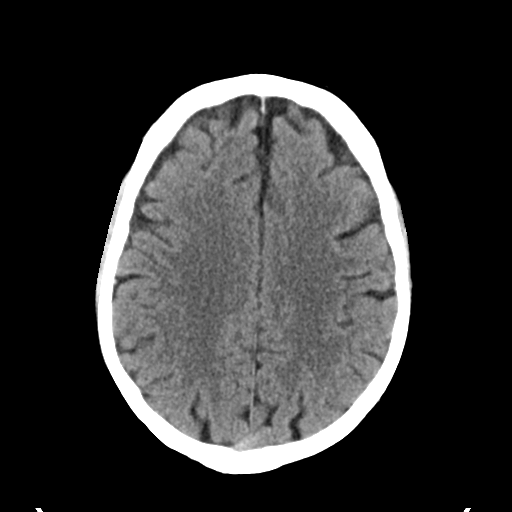
[im 24/34  brain]
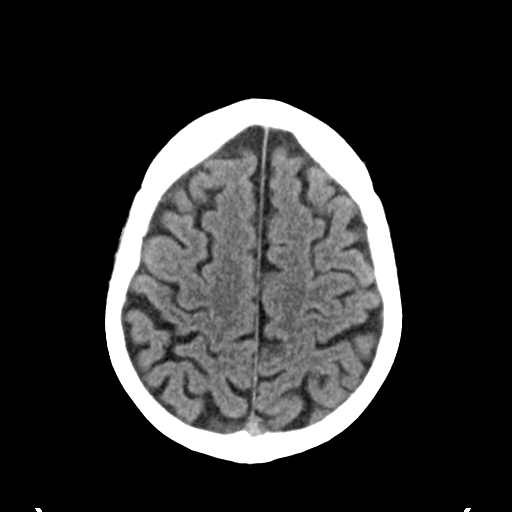
[im 28/34  brain]
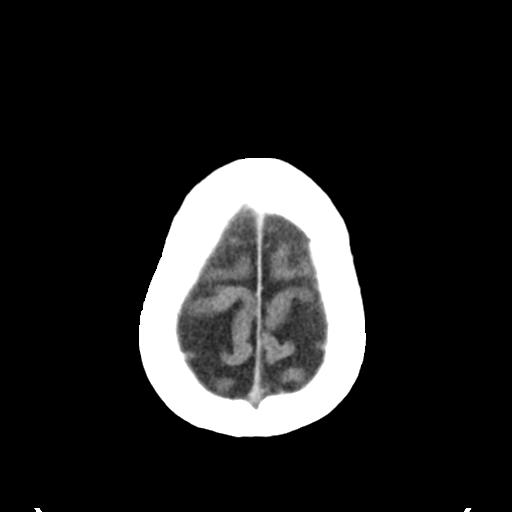
[im 31/34  brain]
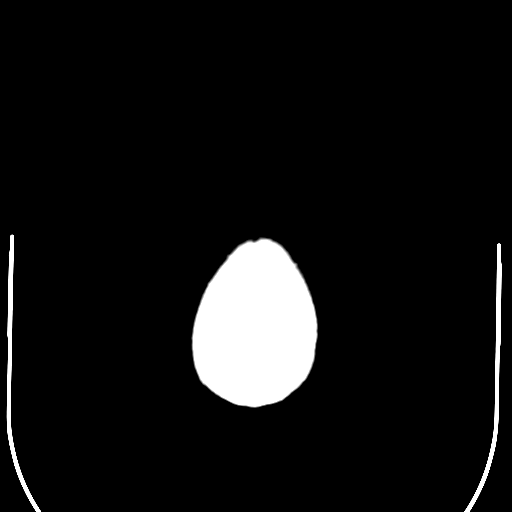
[im 31/34  bone]
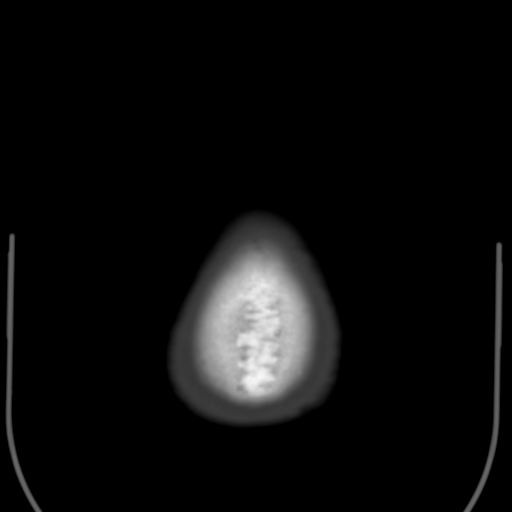

[Series 5: head 3.0 mpr cor · coronal · 0.33mm/px · 3 of 73 slices shown]
[im 25/73  brain]
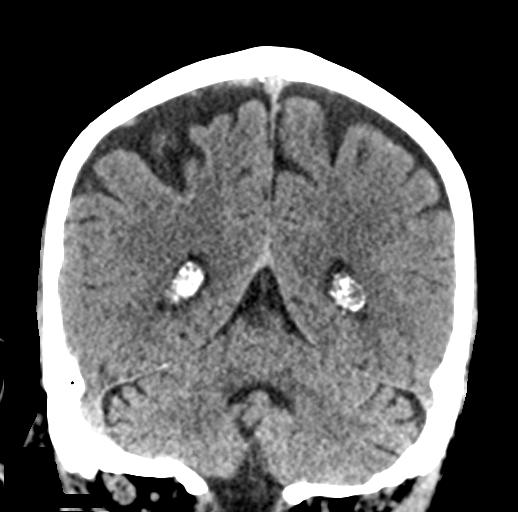
[im 33/73  brain]
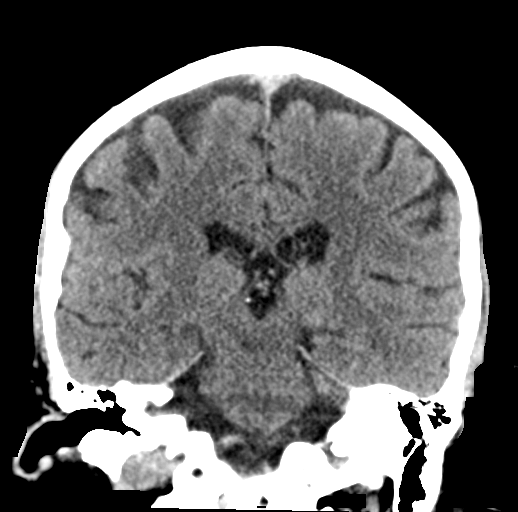
[im 41/73  brain]
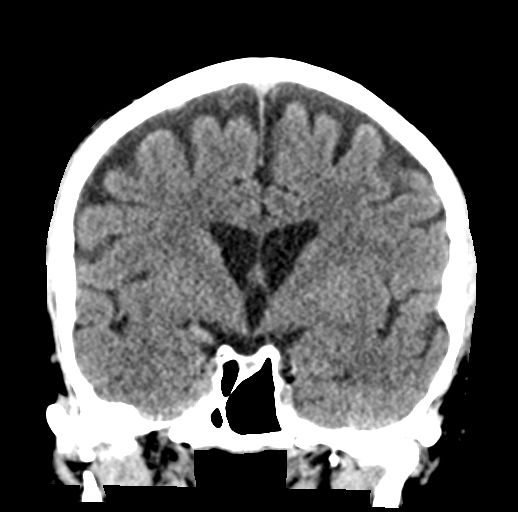

[Series 6: head 3.0 mpr sag · sagittal · 0.32mm/px · 3 of 60 slices shown]
[im 20/60  brain]
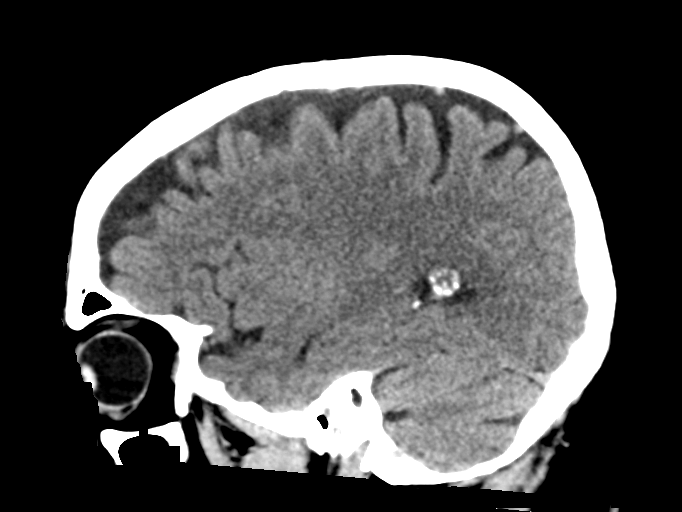
[im 30/60  brain]
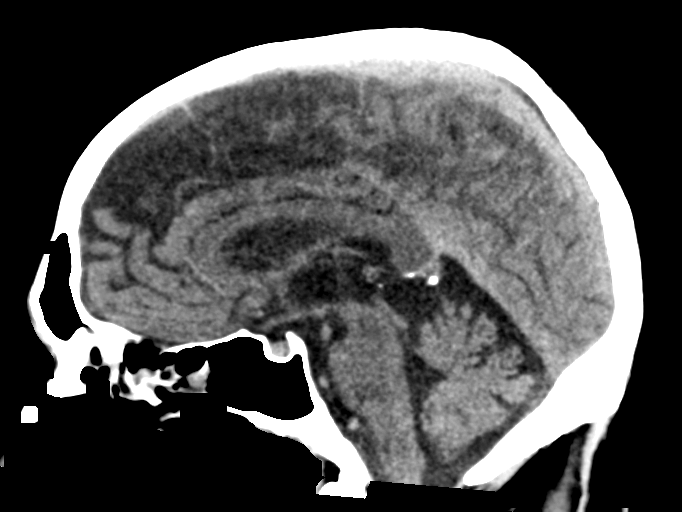
[im 40/60  brain]
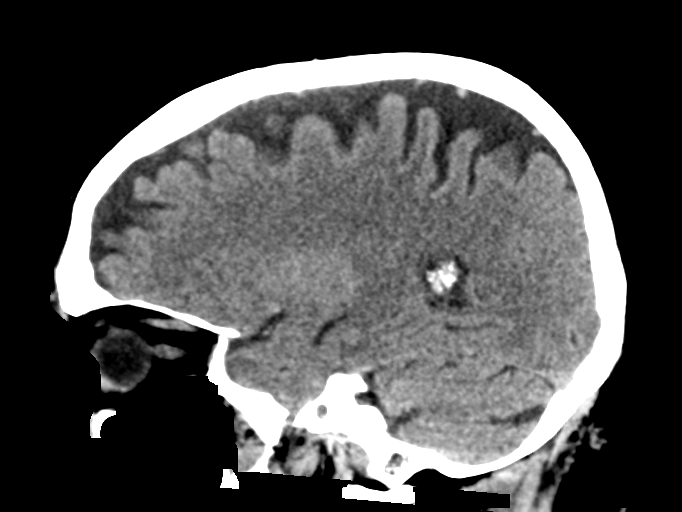

[15 of 47 positions shown; findings below may reference images not displayed]

FINDINGS: Brain: Mild to moderate atrophy with sulcal prominence. Scattered
minimal periventricular hypodensities compatible with microvascular
ischemic disease. The gray-white differentiation is otherwise well
maintained without CT evidence of acute large territory infarct. No
intraparenchymal or extra-axial mass or hemorrhage.

Vascular: Minimal intracranial atherosclerosis.

Skull: No displaced calvarial fracture.

Sinuses/Orbits: Mucosal thickening involving the bilateral anterior
and posterior ethmoidal air cells, left greater than right. Minimal
mucosal thickening involving the right maxillary sinus. Partial
opacification of the right sphenoid sinus. The remaining paranasal
sinuses and mastoid air cells are normally aerated. No air-fluid
levels.

Other: Regional soft tissues appear normal.
IMPRESSION: Mild atrophy and microvascular ischemic disease without acute
intracranial process.

## 2019-09-22 DIAGNOSIS — Z85820 Personal history of malignant melanoma of skin: Secondary | ICD-10-CM | POA: Diagnosis not present

## 2019-09-22 DIAGNOSIS — L821 Other seborrheic keratosis: Secondary | ICD-10-CM | POA: Diagnosis not present

## 2019-09-22 DIAGNOSIS — L570 Actinic keratosis: Secondary | ICD-10-CM | POA: Diagnosis not present

## 2019-10-26 DIAGNOSIS — Z20828 Contact with and (suspected) exposure to other viral communicable diseases: Secondary | ICD-10-CM | POA: Diagnosis not present

## 2019-12-10 DIAGNOSIS — H2513 Age-related nuclear cataract, bilateral: Secondary | ICD-10-CM | POA: Diagnosis not present

## 2020-01-07 DIAGNOSIS — R7302 Impaired glucose tolerance (oral): Secondary | ICD-10-CM | POA: Diagnosis not present

## 2020-01-07 DIAGNOSIS — Z136 Encounter for screening for cardiovascular disorders: Secondary | ICD-10-CM | POA: Diagnosis not present

## 2020-01-07 DIAGNOSIS — Z13220 Encounter for screening for lipoid disorders: Secondary | ICD-10-CM | POA: Diagnosis not present

## 2020-01-07 DIAGNOSIS — Z0000 Encounter for general adult medical examination without abnormal findings: Secondary | ICD-10-CM | POA: Diagnosis not present

## 2020-01-07 DIAGNOSIS — Z125 Encounter for screening for malignant neoplasm of prostate: Secondary | ICD-10-CM | POA: Diagnosis not present

## 2020-06-28 DIAGNOSIS — L290 Pruritus ani: Secondary | ICD-10-CM | POA: Diagnosis not present

## 2020-06-28 DIAGNOSIS — Z85820 Personal history of malignant melanoma of skin: Secondary | ICD-10-CM | POA: Diagnosis not present

## 2020-06-28 DIAGNOSIS — L570 Actinic keratosis: Secondary | ICD-10-CM | POA: Diagnosis not present

## 2020-12-12 DIAGNOSIS — H2513 Age-related nuclear cataract, bilateral: Secondary | ICD-10-CM | POA: Diagnosis not present

## 2021-01-10 DIAGNOSIS — Z136 Encounter for screening for cardiovascular disorders: Secondary | ICD-10-CM | POA: Diagnosis not present

## 2021-01-10 DIAGNOSIS — Z0000 Encounter for general adult medical examination without abnormal findings: Secondary | ICD-10-CM | POA: Diagnosis not present

## 2021-01-10 DIAGNOSIS — R7302 Impaired glucose tolerance (oral): Secondary | ICD-10-CM | POA: Diagnosis not present

## 2021-01-10 DIAGNOSIS — Z13220 Encounter for screening for lipoid disorders: Secondary | ICD-10-CM | POA: Diagnosis not present

## 2021-01-10 DIAGNOSIS — Z125 Encounter for screening for malignant neoplasm of prostate: Secondary | ICD-10-CM | POA: Diagnosis not present

## 2021-06-28 DIAGNOSIS — Z1283 Encounter for screening for malignant neoplasm of skin: Secondary | ICD-10-CM | POA: Diagnosis not present

## 2021-06-28 DIAGNOSIS — L821 Other seborrheic keratosis: Secondary | ICD-10-CM | POA: Diagnosis not present

## 2021-06-28 DIAGNOSIS — L570 Actinic keratosis: Secondary | ICD-10-CM | POA: Diagnosis not present

## 2021-06-28 DIAGNOSIS — Z85820 Personal history of malignant melanoma of skin: Secondary | ICD-10-CM | POA: Diagnosis not present

## 2021-06-28 DIAGNOSIS — D485 Neoplasm of uncertain behavior of skin: Secondary | ICD-10-CM | POA: Diagnosis not present
